# Patient Record
Sex: Female | Born: 1980 | Race: White | Hispanic: No | Marital: Married | Smoking: Never smoker
Health system: Southern US, Community
[De-identification: ages and names within clinical notes are randomized; demographics above are authoritative.]

## PROBLEM LIST (undated history)

## (undated) DIAGNOSIS — R519 Headache, unspecified: Secondary | ICD-10-CM

## (undated) DIAGNOSIS — T8859XA Other complications of anesthesia, initial encounter: Secondary | ICD-10-CM

## (undated) DIAGNOSIS — Z803 Family history of malignant neoplasm of breast: Secondary | ICD-10-CM

## (undated) DIAGNOSIS — T4145XA Adverse effect of unspecified anesthetic, initial encounter: Secondary | ICD-10-CM

## (undated) DIAGNOSIS — R51 Headache: Secondary | ICD-10-CM

## (undated) DIAGNOSIS — IMO0002 Reserved for concepts with insufficient information to code with codable children: Secondary | ICD-10-CM

## (undated) HISTORY — PX: TONSILLECTOMY: SUR1361

## (undated) HISTORY — DX: Family history of malignant neoplasm of breast: Z80.3

---

## 2004-10-11 ENCOUNTER — Emergency Department (HOSPITAL_COMMUNITY): Admission: EM | Admit: 2004-10-11 | Discharge: 2004-10-11 | Payer: Self-pay | Admitting: Emergency Medicine

## 2004-10-26 ENCOUNTER — Ambulatory Visit (HOSPITAL_COMMUNITY): Admission: RE | Admit: 2004-10-26 | Discharge: 2004-10-26 | Payer: Self-pay | Admitting: Preventative Medicine

## 2004-11-02 ENCOUNTER — Encounter (HOSPITAL_COMMUNITY): Admission: RE | Admit: 2004-11-02 | Discharge: 2004-12-02 | Payer: Self-pay | Admitting: Preventative Medicine

## 2010-04-10 ENCOUNTER — Emergency Department: Payer: Self-pay

## 2010-08-11 ENCOUNTER — Emergency Department: Payer: Self-pay | Admitting: Emergency Medicine

## 2010-09-08 ENCOUNTER — Encounter: Payer: Self-pay | Admitting: Obstetrics & Gynecology

## 2010-10-31 ENCOUNTER — Other Ambulatory Visit: Payer: Self-pay | Admitting: Neurology

## 2011-02-13 ENCOUNTER — Ambulatory Visit: Payer: Self-pay | Admitting: Obstetrics & Gynecology

## 2011-02-14 ENCOUNTER — Inpatient Hospital Stay: Payer: Self-pay | Admitting: Obstetrics & Gynecology

## 2011-12-05 HISTORY — PX: DILATION AND CURETTAGE OF UTERUS: SHX78

## 2012-03-23 ENCOUNTER — Emergency Department: Payer: Self-pay | Admitting: Emergency Medicine

## 2012-03-23 LAB — COMPREHENSIVE METABOLIC PANEL
Albumin: 4.3 g/dL (ref 3.4–5.0)
Alkaline Phosphatase: 62 U/L (ref 50–136)
Anion Gap: 10 (ref 7–16)
BUN: 13 mg/dL (ref 7–18)
Bilirubin,Total: 0.3 mg/dL (ref 0.2–1.0)
Calcium, Total: 8.9 mg/dL (ref 8.5–10.1)
Chloride: 107 mmol/L (ref 98–107)
Co2: 23 mmol/L (ref 21–32)
Creatinine: 0.56 mg/dL — ABNORMAL LOW (ref 0.60–1.30)
EGFR (African American): >60
EGFR (Non-African Amer.): >60
Glucose: 89 mg/dL (ref 65–99)
Osmolality: 279 (ref 275–301)
Potassium: 3.9 mmol/L (ref 3.5–5.1)
SGOT(AST): 17 U/L (ref 15–37)
SGPT (ALT): 21 U/L
Sodium: 140 mmol/L (ref 136–145)
Total Protein: 7.6 g/dL (ref 6.4–8.2)

## 2012-03-23 LAB — URINALYSIS, COMPLETE
Bilirubin,UR: NEGATIVE
Blood: NEGATIVE
Glucose,UR: NEGATIVE mg/dL (ref 0–75)
Ketone: NEGATIVE
Leukocyte Esterase: NEGATIVE
Nitrite: NEGATIVE
Ph: 7 (ref 4.5–8.0)
Protein: NEGATIVE
RBC,UR: <1 /HPF (ref 0–5)
Specific Gravity: 1.006 (ref 1.003–1.030)
Squamous Epithelial: <1
WBC UR: 1 /HPF (ref 0–5)

## 2012-03-23 LAB — CBC
HCT: 43 % (ref 35.0–47.0)
HGB: 13.8 g/dL (ref 12.0–16.0)
MCH: 28 pg (ref 26.0–34.0)
MCHC: 32.1 g/dL (ref 32.0–36.0)
MCV: 87 fL (ref 80–100)
Platelet: 229 10*3/uL (ref 150–440)
RBC: 4.91 10*6/uL (ref 3.80–5.20)
RDW: 13.3 % (ref 11.5–14.5)
WBC: 6.7 10*3/uL (ref 3.6–11.0)

## 2012-03-23 LAB — PREGNANCY, URINE: Pregnancy Test, Urine: POSITIVE m[IU]/mL

## 2012-03-23 LAB — LIPASE, BLOOD: Lipase: 121 U/L (ref 73–393)

## 2012-03-23 LAB — WET PREP, GENITAL

## 2012-03-23 LAB — HCG, QUANTITATIVE, PREGNANCY: Beta Hcg, Quant.: 27725 m[IU]/mL — ABNORMAL HIGH

## 2012-04-09 LAB — BASIC METABOLIC PANEL
Anion Gap: 9 (ref 7–16)
BUN: 13 mg/dL (ref 7–18)
Calcium, Total: 9.7 mg/dL (ref 8.5–10.1)
Chloride: 104 mmol/L (ref 98–107)
Co2: 23 mmol/L (ref 21–32)
Creatinine: 0.72 mg/dL (ref 0.60–1.30)
EGFR (African American): >60
EGFR (Non-African Amer.): >60
Glucose: 114 mg/dL — ABNORMAL HIGH (ref 65–99)
Osmolality: 273 (ref 275–301)
Potassium: 3.7 mmol/L (ref 3.5–5.1)
Sodium: 136 mmol/L (ref 136–145)

## 2012-04-09 LAB — URINALYSIS, COMPLETE
Bilirubin,UR: NEGATIVE
Blood: NEGATIVE
Glucose,UR: NEGATIVE mg/dL (ref 0–75)
Leukocyte Esterase: NEGATIVE
Nitrite: NEGATIVE
Ph: 8 (ref 4.5–8.0)
Protein: NEGATIVE
RBC,UR: NONE SEEN /HPF (ref 0–5)
Specific Gravity: 1.018 (ref 1.003–1.030)
Squamous Epithelial: NONE SEEN
WBC UR: 1 /HPF (ref 0–5)

## 2012-04-09 LAB — CBC
HCT: 44.8 % (ref 35.0–47.0)
HGB: 15.1 g/dL (ref 12.0–16.0)
MCH: 29.4 pg (ref 26.0–34.0)
MCHC: 33.7 g/dL (ref 32.0–36.0)
MCV: 87 fL (ref 80–100)
Platelet: 268 10*3/uL (ref 150–440)
RBC: 5.13 10*6/uL (ref 3.80–5.20)
RDW: 13.6 % (ref 11.5–14.5)
WBC: 10.8 10*3/uL (ref 3.6–11.0)

## 2012-04-09 LAB — HCG, QUANTITATIVE, PREGNANCY: Beta Hcg, Quant.: 1455 m[IU]/mL — ABNORMAL HIGH

## 2012-04-09 LAB — WET PREP, GENITAL

## 2012-04-10 ENCOUNTER — Observation Stay: Payer: Self-pay | Admitting: Obstetrics & Gynecology

## 2012-04-10 LAB — HEMOGLOBIN: HGB: 10.8 g/dL — ABNORMAL LOW (ref 12.0–16.0)

## 2012-04-10 LAB — HEMATOCRIT: HCT: 32.4 % — ABNORMAL LOW (ref 35.0–47.0)

## 2012-04-11 LAB — PATHOLOGY REPORT

## 2014-07-15 ENCOUNTER — Ambulatory Visit: Payer: Self-pay | Admitting: Obstetrics & Gynecology

## 2014-07-15 LAB — CBC WITH DIFFERENTIAL/PLATELET
Basophil #: 0.1 10*3/uL (ref 0.0–0.1)
Basophil %: 0.7 %
Eosinophil #: 0.3 10*3/uL (ref 0.0–0.7)
Eosinophil %: 3.2 %
HCT: 36 % (ref 35.0–47.0)
HGB: 11.4 g/dL — ABNORMAL LOW (ref 12.0–16.0)
Lymphocyte #: 1.5 10*3/uL (ref 1.0–3.6)
Lymphocyte %: 16.2 %
MCH: 26.6 pg (ref 26.0–34.0)
MCHC: 31.7 g/dL — ABNORMAL LOW (ref 32.0–36.0)
MCV: 84 fL (ref 80–100)
Monocyte #: 0.7 x10 3/mm (ref 0.2–0.9)
Monocyte %: 7.2 %
Neutrophil #: 6.7 10*3/uL — ABNORMAL HIGH (ref 1.4–6.5)
Neutrophil %: 72.7 %
Platelet: 182 10*3/uL (ref 150–440)
RBC: 4.3 10*6/uL (ref 3.80–5.20)
RDW: 17.9 % — ABNORMAL HIGH (ref 11.5–14.5)
WBC: 9.2 10*3/uL (ref 3.6–11.0)

## 2014-07-16 ENCOUNTER — Inpatient Hospital Stay: Payer: Self-pay | Admitting: Obstetrics & Gynecology

## 2014-07-17 LAB — HEMATOCRIT: HCT: 33.5 % — ABNORMAL LOW (ref 35.0–47.0)

## 2015-03-27 NOTE — Op Note (Signed)
PATIENT NAME:  Amber RaddleMCKINNEY, [name] MR#:  [id number] DATE OF BIRTH:  [birth year]  DATE OF PROCEDURE:  07/16/2014  PREOPERATIVE DIAGNOSES:   1.  Term intrauterine pregnancy. 2.  Prior history of cesarean section.   POSTOPERATIVE DIAGNOSES:  1.  Term intrauterine pregnancy. 2.  Prior history of cesarean section.   PROCEDURE:  1.  Low transverse cesarean section. 2.  Placement of On-Q pain pump.   SURGEON: Dierdre Searles. Paul Rhiann Boucher, MD.  ASSISTANVergie Living: [place].  ANESTHESIA: General.  ESTIMATED BLOOD LOSS: 300 mL.   COMPLICATIONS: None.   FINDINGS: Normal tubes, ovaries and uterus. Viable female weighing 615 with Apgar scores of 9 and 9.    DISPOSITION:  Recovery in stable.   TECHNIQUE: The patient is prepped and draped in the usual sterile fashion. After adequate anesthesia is obtained in the supine position on the operating table, scalpel is used to create a low transverse skin incision in the area of the prior scar, down to the level of the rectus fascia. The rectus fascia dissected bilaterally using Mayo scissors. The rectus muscle is separated in the midline. The peritoneum is penetrated and the bladder inferiorly dissected and retracted. A scalpel is used to create a low transverse hysterotomy incision that has been extended by blunt dissection.  Amniotomy then reveals clear fluid and the head is easily delivered without the use of a vacuum device. There oropharynx is suctioned and the nuchal cords are reduced, and the infant is completely delivered at that time.   Cord blood is not necessary as she has an A positive blood type and so the placenta is manually extracted. The uterus is externalized and cleansed of all membranes and debris using a moist sponge. The hysterotomy incision is closed with a running 1 Vicryl suture in a locking fashion with additional suture placed to obtain excellent hemostasis. The bladder is deemed to be out of harm's way and clear urine is noted in the catheter. The uterus was  placed back into the intra-abdominal cavity and the pericolic gutters area irrigated with warm saline. Re-examination  reveals excellent hemostasis.   The peritoneum is closed with a Vicryl suture. Trocars are placed through the abdomen into the  subfascial space and then the SilverSoaker catheters, that is associated with the On-Q pain pump, are then threaded into place. The rectus fascia is closed with 0 Maxon suture with careful placement not to incorporate these catheters.  The catheters are flushed and with 5 mL each of bupivacaine.   Subcutaneous tissues are irrigated and hemostasis and hemostasis is assured using electrocautery. Skin is closed with a 4-0 Vicryl in a subcuticular fashion followed by placement of Dermabond.  Patient left for the recovery room in stable condition.  All sponge, instrument, and needle counts are correct.   ____________________________ R. Annamarie MajorPaul Stein Windhorst, MD rph:TT D: 07/16/2014 08:49:00 ET T: 07/16/2014 11:33:20 ET JOB#: [id number]  cc: Dierdre Searles. Paul Abdulloh Ullom, MD, <Dictator> Nadara MustardOBERT P Josanna Hefel MD ELECTRONICALLY SIGNED 07/17/2014 0:40

## 2015-03-28 NOTE — Op Note (Signed)
PATIENT NAME:  Amber RaddleMCKINNEY, Amber Reid MR#:  [id number] DATE OF BIRTH:  08-Apr-1981  DATE OF PROCEDURE:  04/09/2012  PREOPERATIVE DIAGNOSIS: Incomplete abortion.   POSTOPERATIVE DIAGNOSIS: Incomplete abortion.  PROCEDURE: Suction dilatation and curettage.   SURGEON: Dierdre Searles. Paul Karlie Aung, M.D.   ANESTHESIA: General.   ESTIMATED BLOOD LOSS: Minimal.   COMPLICATIONS: None.   SPECIMEN: Products of conception.   DISPOSITION: To the recovery room in stable condition.   TECHNIQUE: The patient is prepped and draped in the usual sterile fashion, after adequate anesthesia is obtained, in the dorsal lithotomy position. Foley catheter which was placed in the emergency room is removed at this time. A speculum is placed and the anterior lip of the cervix is grasped with a tenaculum. The cervix is easily dilated to a size 20 Pratt dilator. The uterus is sounded to 10 cm. Grasping forceps are used to remove any initial products of conception. A 10 mm rigid suction curette is placed with aspiration of products of conception. Gentle curettage is performed with a banjo curette followed by repeat aspiration. Tenaculum is removed with excellent hemostasis noted. The patient goes to the recovery room in stable condition. All sponge, instrument, and needle counts are correct.  ____________________________ R. Annamarie MajorPaul Havier Deeb, MD rph:slb D: 04/10/2012 00:15:42 ET     T: 04/10/2012 10:19:08 ET        JOB#: [id number] Amber Carbajal P Braxley Balandran MD ELECTRONICALLY SIGNED 04/19/2012 10:07

## 2015-09-07 ENCOUNTER — Other Ambulatory Visit: Payer: Self-pay

## 2015-09-08 ENCOUNTER — Encounter: Payer: Self-pay | Admitting: *Deleted

## 2015-09-08 ENCOUNTER — Other Ambulatory Visit: Payer: Self-pay

## 2015-09-08 NOTE — Patient Instructions (Signed)
  Your procedure is scheduled on: 09-10-15 Report to [facility] 2ND FLOOR To find out your arrival time please call [phone] between 1PM - 3PM on 09-09-15  Remember: Instructions that are not followed completely may result in serious medical risk, up to and including death, or upon the discretion of your surgeon and anesthesiologist your surgery may need to be rescheduled.    _X___ 1. Do not eat food or drink liquids after midnight. No gum chewing or hard candies.     _X___ 2. No Alcohol for 24 hours before or after surgery.   ____ 3. Bring all medications with you on the day of surgery if instructed.    ____ 4. Notify your doctor if there is any change in your medical condition     (cold, fever, infections).     Do not wear jewelry, make-up, hairpins, clips or nail polish.  Do not wear lotions, powders, or perfumes. You may wear deodorant.  Do not shave 48 hours prior to surgery. Men may shave face and neck.  Do not bring valuables to the hospital.    [facility] is not responsible for any belongings or valuables.               Contacts, dentures or bridgework may not be worn into surgery.  Leave your suitcase in the car. After surgery it may be brought to your room.  For patients admitted to the hospital, discharge time is determined by your treatment team.   Patients discharged the day of surgery will not be allowed to drive home.   Please read over the following fact sheets that you were given:      ____ Take these medicines the morning of surgery with A SIP OF WATER:    1.NONE  2.   3.   4.  5.  6.  ____ Fleet Enema (as directed)   ____ Use CHG Soap as directed  ____ Use inhalers on the day of surgery  ____ Stop metformin 2 days prior to surgery    ____ Take 1/2 of usual insulin dose the night before surgery and none on the morning of surgery.   ____ Stop Coumadin/Plavix/aspirin-N/A  ____ Stop Anti-inflammatories-NO NSAIDS OR ASA  PRODUCTS-TYLENOL OK   ____ Stop supplements until after surgery.    ____ Bring C-Pap to the hospital.  

## 2015-09-10 ENCOUNTER — Ambulatory Visit
Admission: RE | Admit: 2015-09-10 | Discharge: 2015-09-10 | Disposition: A | Discharge status: Home / Self Care | Payer: BLUE CROSS/BLUE SHIELD | Source: Ambulatory Visit | Attending: Obstetrics & Gynecology | Admitting: Obstetrics & Gynecology

## 2015-09-10 ENCOUNTER — Encounter: Payer: Self-pay | Admitting: *Deleted

## 2015-09-10 ENCOUNTER — Ambulatory Visit: Payer: BLUE CROSS/BLUE SHIELD | Admitting: Anesthesiology

## 2015-09-10 ENCOUNTER — Encounter
Admission: RE | Disposition: A | Discharge status: Home / Self Care | Payer: Self-pay | Source: Ambulatory Visit | Attending: Obstetrics & Gynecology

## 2015-09-10 DIAGNOSIS — Z8249 Family history of ischemic heart disease and other diseases of the circulatory system: Secondary | ICD-10-CM | POA: Diagnosis not present

## 2015-09-10 DIAGNOSIS — L728 Other follicular cysts of the skin and subcutaneous tissue: Secondary | ICD-10-CM | POA: Diagnosis not present

## 2015-09-10 DIAGNOSIS — Z8051 Family history of malignant neoplasm of kidney: Secondary | ICD-10-CM | POA: Insufficient documentation

## 2015-09-10 DIAGNOSIS — Z818 Family history of other mental and behavioral disorders: Secondary | ICD-10-CM | POA: Insufficient documentation

## 2015-09-10 DIAGNOSIS — Z808 Family history of malignant neoplasm of other organs or systems: Secondary | ICD-10-CM | POA: Insufficient documentation

## 2015-09-10 DIAGNOSIS — Z888 Allergy status to other drugs, medicaments and biological substances status: Secondary | ICD-10-CM | POA: Diagnosis not present

## 2015-09-10 DIAGNOSIS — Z833 Family history of diabetes mellitus: Secondary | ICD-10-CM | POA: Insufficient documentation

## 2015-09-10 DIAGNOSIS — Z825 Family history of asthma and other chronic lower respiratory diseases: Secondary | ICD-10-CM | POA: Insufficient documentation

## 2015-09-10 HISTORY — DX: Adverse effect of unspecified anesthetic, initial encounter: T41.45XA

## 2015-09-10 HISTORY — DX: Reserved for concepts with insufficient information to code with codable children: IMO0002

## 2015-09-10 HISTORY — DX: Headache: R51

## 2015-09-10 HISTORY — PX: LAPAROTOMY: SHX154

## 2015-09-10 HISTORY — DX: Headache, unspecified: R51.9

## 2015-09-10 HISTORY — DX: Other complications of anesthesia, initial encounter: T88.59XA

## 2015-09-10 LAB — POCT PREGNANCY, URINE: Preg Test, Ur: NEGATIVE

## 2015-09-10 SURGERY — LAPAROTOMY, EXPLORATORY
Anesthesia: General

## 2015-09-10 MED ORDER — FAMOTIDINE 20 MG PO TABS
ORAL_TABLET | ORAL | Status: AC
  Administered 2015-09-10: 20 mg via ORAL
  Filled 2015-09-10: qty 1

## 2015-09-10 MED ORDER — KETOROLAC TROMETHAMINE 30 MG/ML IJ SOLN
30.0000 mg | Freq: Four times a day (QID) | INTRAMUSCULAR | Status: DC
  Administered 2015-09-10: 30 mg via INTRAVENOUS

## 2015-09-10 MED ORDER — PROPOFOL 10 MG/ML IV BOLUS
INTRAVENOUS | Status: DC | PRN
  Administered 2015-09-10 (×2): 20 mg via INTRAVENOUS
  Administered 2015-09-10: 50 mg via INTRAVENOUS
  Administered 2015-09-10: 20 mg via INTRAVENOUS

## 2015-09-10 MED ORDER — FAMOTIDINE 20 MG PO TABS
20.0000 mg | ORAL_TABLET | Freq: Once | ORAL | Status: AC
  Administered 2015-09-10: 20 mg via ORAL

## 2015-09-10 MED ORDER — ONDANSETRON HCL 4 MG/2ML IJ SOLN
4.0000 mg | Freq: Once | INTRAMUSCULAR | Status: AC | PRN
  Administered 2015-09-10: 4 mg via INTRAVENOUS

## 2015-09-10 MED ORDER — ONDANSETRON HCL 4 MG/2ML IJ SOLN
INTRAMUSCULAR | Status: AC
  Administered 2015-09-10: 4 mg via INTRAVENOUS
  Filled 2015-09-10: qty 2

## 2015-09-10 MED ORDER — LIDOCAINE-EPINEPHRINE 1 %-1:100000 IJ SOLN
INTRAMUSCULAR | Status: AC
  Filled 2015-09-10: qty 2

## 2015-09-10 MED ORDER — ACETAMINOPHEN 650 MG RE SUPP: 650.0000 mg | RECTAL | Status: DC | PRN

## 2015-09-10 MED ORDER — PROPOFOL 500 MG/50ML IV EMUL
INTRAVENOUS | Status: DC | PRN
  Administered 2015-09-10: 100 ug/kg/min via INTRAVENOUS
  Administered 2015-09-10: 17:00:00 via INTRAVENOUS

## 2015-09-10 MED ORDER — KETOROLAC TROMETHAMINE 30 MG/ML IJ SOLN
INTRAMUSCULAR | Status: AC
  Administered 2015-09-10: 30 mg via INTRAVENOUS
  Filled 2015-09-10: qty 1

## 2015-09-10 MED ORDER — ACETAMINOPHEN 325 MG PO TABS: 650.0000 mg | ORAL_TABLET | ORAL | Status: DC | PRN

## 2015-09-10 MED ORDER — OXYCODONE HCL 5 MG PO TABS: 5.0000 mg | ORAL_TABLET | ORAL | Status: DC | PRN

## 2015-09-10 MED ORDER — LACTATED RINGERS IV SOLN
INTRAVENOUS | Status: DC
  Administered 2015-09-10: 15:00:00 via INTRAVENOUS

## 2015-09-10 MED ORDER — LIDOCAINE HCL (CARDIAC) 20 MG/ML IV SOLN
INTRAVENOUS | Status: DC | PRN
  Administered 2015-09-10: 50 mg via INTRAVENOUS

## 2015-09-10 MED ORDER — LIDOCAINE HCL (PF) 1 % IJ SOLN
INTRAMUSCULAR | Status: AC
  Filled 2015-09-10: qty 30

## 2015-09-10 SURGICAL SUPPLY — 36 items
BAG COUNTER SPONGE EZ (MISCELLANEOUS) ×2 IMPLANT
BAG SPNG 4X4 CLR HAZ (MISCELLANEOUS) ×1
CANISTER SUCT 1200ML W/VALVE (MISCELLANEOUS) ×3 IMPLANT
CATH FOL LEG HOLDER (MISCELLANEOUS) ×3 IMPLANT
CATH TRAY 16F METER LATEX (MISCELLANEOUS) ×3 IMPLANT
CLOSURE WOUND 1/2 X4 (GAUZE/BANDAGES/DRESSINGS) ×1
COUNTER SPONGE BAG EZ (MISCELLANEOUS) ×1
DRAPE LAPAROTOMY 100X77 ABD (DRAPES) ×3 IMPLANT
DRAPE LAPAROTOMY TRNSV 106X77 (MISCELLANEOUS) ×3 IMPLANT
DRSG TELFA 3X8 NADH (GAUZE/BANDAGES/DRESSINGS) ×3 IMPLANT
ELECT BLADE 6 FLAT ULTRCLN (ELECTRODE) ×3 IMPLANT
ELECT CAUTERY BLADE 6.4 (BLADE) ×3 IMPLANT
GAUZE SPONGE 4X4 12PLY STRL (GAUZE/BANDAGES/DRESSINGS) ×3 IMPLANT
GLOVE BIO SURGEON STRL SZ8 (GLOVE) ×3 IMPLANT
GOWN STRL REUS W/ TWL LRG LVL3 (GOWN DISPOSABLE) ×1 IMPLANT
GOWN STRL REUS W/ TWL XL LVL3 (GOWN DISPOSABLE) ×1 IMPLANT
GOWN STRL REUS W/TWL LRG LVL3 (GOWN DISPOSABLE) ×3
GOWN STRL REUS W/TWL XL LVL3 (GOWN DISPOSABLE) ×3
KIT RM TURNOVER STRD PROC AR (KITS) ×3 IMPLANT
LABEL OR SOLS (LABEL) ×3 IMPLANT
NS IRRIG 1000ML POUR BTL (IV SOLUTION) ×3 IMPLANT
PACK BASIN MAJOR ARMC (MISCELLANEOUS) ×3 IMPLANT
PAD DRESSING TELFA 3X8 NADH (GAUZE/BANDAGES/DRESSINGS) ×1 IMPLANT
PAD GROUND ADULT SPLIT (MISCELLANEOUS) ×3 IMPLANT
PAD OB MATERNITY 4.3X12.25 (PERSONAL CARE ITEMS) ×3 IMPLANT
SPONGE LAP 18X18 5 PK (GAUZE/BANDAGES/DRESSINGS) ×3 IMPLANT
STAPLER SKIN PROX 35W (STAPLE) ×3 IMPLANT
STRIP CLOSURE SKIN 1/2X4 (GAUZE/BANDAGES/DRESSINGS) ×2 IMPLANT
SUT ETHIBOND CT1 BRD #0 30IN (SUTURE) ×3 IMPLANT
SUT VIC AB 0 CT1 27 (SUTURE) ×3
SUT VIC AB 0 CT1 27XCR 8 STRN (SUTURE) ×1 IMPLANT
SUT VIC AB 1 CT1 36 (SUTURE) ×3 IMPLANT
SUT VIC AB 4-0 PS2 18 (SUTURE) ×3 IMPLANT
SYR 30ML LL (SYRINGE) ×3 IMPLANT
SYRINGE 10CC LL (SYRINGE) ×3 IMPLANT
TRAY PREP VAG/GEN (MISCELLANEOUS) ×3 IMPLANT

## 2015-09-10 NOTE — Op Note (Signed)
  Operative Note  09/10/2015  PRE-OP DIAGNOSIS:  INCISIONAL MASS   POST-OP DIAGNOSIS: same   PROCEDURE: Procedure(s):      Excision of abdominal mass   SURGEON: Annamarie Major, MD, FACOG   ANESTHESIA: Local Sedation   ESTIMATED BLOOD LOSS: Minimal  SPECIMENS: Subcutaneous cystic mass  COMPLICATIONS: None  DISPOSITION: PACU - hemodynamically stable.  CONDITION: stable  FINDINGS: Cystic mass underneath cesarean scar, no fascial penetration.  PROCEDURE IN DETAIL: After informed consent was obtained, the patient was taken to the operating room where sedation anesthesia was performed without difficulty. The patient was positioned in the supine position and the usual precautions taken with her arms in arm boards bilaterally.She was prepped and draped in normal sterile fashion. Time-out was performed.  A small incision was made just over the 4cm palpable mass after marcaine 1/2% with epi was used (5 mL) to infiltrate the skin.  Cyst was grasped with Allis clamps to excise.  It was penetrated during the excision with clear/yellow thin fluid noted.  Continued dissection around the cystic mass was performed until complete excision is accomplished with no remnants of tissue left behind.  Irrigation performed.  Hemostasis assured.  Deep layer of 2-0 vicryl and skin layer of 4-0 vicryl used followed by skin adhesive.  Sponge, lap, needle and instrument counts were correct x2 at the end of the procedure. Patient goes to recovery room in stable condition.

## 2015-09-10 NOTE — Anesthesia Preprocedure Evaluation (Signed)
Anesthesia Evaluation  Patient identified by MRN, date of birth, ID band Patient awake    Reviewed: Allergy & Precautions, NPO status , Patient's Chart, lab work & pertinent test results  History of Anesthesia Complications Negative for: history of anesthetic complications  Airway Mallampati: I  TM Distance: >3 FB Neck ROM: Full    Dental  (+) Teeth Intact   Pulmonary neg pulmonary ROS,           Cardiovascular negative cardio ROS       Neuro/Psych negative neurological ROS     GI/Hepatic negative GI ROS, Neg liver ROS,   Endo/Other  negative endocrine ROS  Renal/GU negative Renal ROS     Musculoskeletal   Abdominal   Peds  Hematology negative hematology ROS (+)   Anesthesia Other Findings   Reproductive/Obstetrics                             Anesthesia Physical Anesthesia Plan  ASA: II  Anesthesia Plan: General   Post-op Pain Management:    Induction: Intravenous  Airway Management Planned:   Additional Equipment:   Intra-op Plan:   Post-operative Plan:   Informed Consent: I have reviewed the patients History and Physical, chart, labs and discussed the procedure including the risks, benefits and alternatives for the proposed anesthesia with the patient or authorized representative who has indicated his/her understanding and acceptance.     Plan Discussed with:   Anesthesia Plan Comments:         Anesthesia Quick Evaluation

## 2015-09-10 NOTE — H&P (Signed)
History and Physical Interval Note:  09/10/2015 2:54 PM  Amber Reid  has presented today for surgery, with the diagnosis of INCISIONAL MASS  The various methods of treatment have been discussed with the patient and family. After consideration of risks, benefits and other options for treatment, the patient has consented to  Procedure(s): EXCISION of ABDOMIAL SUBCUTAENOUS MASS/ Incisional Mass as a surgical intervention .  The patient's history has been reviewed, patient examined, no change in status, stable for surgery.  Pt has the following beta blocker history-  Not taking Beta Blocker.  I have reviewed the patient's chart and labs.  Questions were answered to the patient's satisfaction.       Benzion Mesta [name]

## 2015-09-10 NOTE — Discharge Instructions (Signed)
General Gynecological Post-Operative Instructions °You may expect to feel dizzy, weak, and drowsy for as long as 24 hours after receiving the medicine that made you sleep (anesthetic).  °Do not drive a car, ride a bicycle, participate in physical activities, or take public transportation until you are done taking narcotic pain medicines or as directed by your doctor.  °Do not drink alcohol or take tranquilizers.  °Do not take medicine that has not been prescribed by your doctor.  °Do not sign important papers or make important decisions while on narcotic pain medicines.  °Have a responsible person with you.  °CARE OF INCISION  °Keep incision clean and dry. °Take showers instead of baths until your doctor gives you permission to take baths.  °Avoid heavy lifting (more than 10 pounds/4.5 kilograms), pushing, or pulling.  °Avoid activities that may risk injury to your surgical site.  °No sexual intercourse or placement of anything in the vagina for 1 weeks or as instructed by your doctor. °If you have tubes coming from the wound site, check with your doctor regarding appropriate care of the tubes. °Only take prescription or over-the-counter medicines  for pain, discomfort, or fever as directed by your doctor. Do not take aspirin. It can make you bleed. Take medicines (antibiotics) that kill germs if they are prescribed for you.  °Call the office or go to the ER if:  °You feel sick to your stomach (nauseous) and you start to throw up (vomit).  °You have trouble eating or drinking.  °You have an oral temperature above 101.  °You have constipation that is not helped by adjusting diet or increasing fluid intake. Pain medicines are a common cause of constipation.  °You have any other concerns. °SEEK IMMEDIATE MEDICAL CARE IF:  °You have persistent dizziness.  °You have difficulty breathing or a congested sounding (croupy) cough.  °You have an oral temperature above 102.5, not controlled by medicine.  °There is increasing  pain or tenderness near or in the surgical site.  ° °AMBULATORY SURGERY  °DISCHARGE INSTRUCTIONS ° ° °1) The drugs that you were given will stay in your system until tomorrow so for the next 24 hours you should not: ° °A) Drive an automobile °B) Make any legal decisions °C) Drink any alcoholic beverage ° ° °2) You may resume regular meals tomorrow.  Today it is better to start with liquids and gradually work up to solid foods. ° °You may eat anything you prefer, but it is better to start with liquids, then soup and crackers, and gradually work up to solid foods. ° ° °3) Please notify your doctor immediately if you have any unusual bleeding, trouble breathing, redness and pain at the surgery site, drainage, fever, or pain not relieved by medication. ° ° ° °4) Additional Instructions: ° ° ° ° ° ° ° °Please contact your physician with any problems or Same Day Surgery at [phone], Monday through Friday 6 am to 4 pm, or [place] [place] Main number at [phone]. ° °

## 2015-09-10 NOTE — Transfer of Care (Signed)
Immediate Anesthesia Transfer of Care Note  Patient: Amber Reid  Procedure(s) Performed: Procedure(s): incision of abdominal mass (N/A)  Patient Location: PACU  Anesthesia Type:General  Level of Consciousness: awake  Airway & Oxygen Therapy: Patient Spontanous Breathing and Patient connected to nasal cannula oxygen  Post-op Assessment: Report given to RN  Post vital signs: Reviewed  Last Vitals:  Filed Vitals:   09/10/15 1719  BP: 103/64  Pulse: 64  Temp: 36.2 C  Resp: 18    Complications: No apparent anesthesia complications

## 2015-09-13 ENCOUNTER — Encounter: Payer: Self-pay | Admitting: Obstetrics & Gynecology

## 2015-09-14 LAB — SURGICAL PATHOLOGY

## 2015-09-14 NOTE — Anesthesia Postprocedure Evaluation (Signed)
  Anesthesia Post-op Note  Patient: Amber Reid  Procedure(s) Performed: Procedure(s): incision of abdominal mass (N/A)  Anesthesia type:General  Patient location: PACU  Post pain: Pain level controlled  Post assessment: Post-op Vital signs reviewed, Patient's Cardiovascular Status Stable, Respiratory Function Stable, Patent Airway and No signs of Nausea or vomiting  Post vital signs: Reviewed and stable  Last Vitals:  Filed Vitals:   09/10/15 1801  BP: 106/65  Pulse: 56  Temp: 35.8 C  Resp:     Level of consciousness: awake, alert  and patient cooperative  Complications: No apparent anesthesia complications

## 2020-08-02 ENCOUNTER — Encounter: Payer: Self-pay | Admitting: Obstetrics & Gynecology

## 2020-08-04 ENCOUNTER — Other Ambulatory Visit: Payer: Self-pay | Admitting: Oncology

## 2020-08-04 ENCOUNTER — Telehealth: Payer: Self-pay | Admitting: Oncology

## 2020-08-04 DIAGNOSIS — U071 COVID-19: Secondary | ICD-10-CM

## 2020-08-04 NOTE — Telephone Encounter (Signed)
I connected by phone with  to discuss the potential use of an new treatment for mild to moderate COVID-19 viral infection in non-hospitalized patients.   This patient is a age/sex that meets the FDA criteria for Emergency Use Authorization of casirivimab\imdevimab.  Has a (+) direct SARS-CoV-2 viral test result 1. Has mild or moderate COVID-19  2. Is ? 40 years of age and weighs ? 40 kg 3. Is NOT hospitalized due to COVID-19 4. Is NOT requiring oxygen therapy or requiring an increase in baseline oxygen flow rate due to COVID-19 5. Is within 10 days of symptom onset 6. Has at least one of the high risk factor(s) for progression to severe COVID-19 and/or hospitalization as defined in EUA. ? Specific high risk criteria : Obesity    Symptom onset  07/27/20   I have spoken and communicated the following to the patient or parent/caregiver:   1. FDA has authorized the emergency use of casirivimab\imdevimab for the treatment of mild to moderate COVID-19 in adults and pediatric patients with positive results of direct SARS-CoV-2 viral testing who are 40 years of age and older weighing at least 40 kg, and who are at high risk for progressing to severe COVID-19 and/or hospitalization.   2. The significant known and potential risks and benefits of casirivimab\imdevimab, and the extent to which such potential risks and benefits are unknown.   3. Information on available alternative treatments and the risks and benefits of those alternatives, including clinical trials.   4. Patients treated with casirivimab\imdevimab should continue to self-isolate and use infection control measures (e.g., wear mask, isolate, social distance, avoid sharing personal items, clean and disinfect "high touch" surfaces, and frequent handwashing) according to CDC guidelines.    5. The patient or parent/caregiver has the option to accept or refuse casirivimab\imdevimab .   After reviewing this information with the patient, The  patient agreed to proceed with receiving casirivimab\imdevimab infusion and will be provided a copy of the Fact sheet prior to receiving the infusion.Mignon Pine, AGNP-C [phone] (Infusion Center Hotline)

## 2020-08-05 ENCOUNTER — Other Ambulatory Visit: Payer: Self-pay | Admitting: Adult Health

## 2020-08-05 ENCOUNTER — Ambulatory Visit (HOSPITAL_COMMUNITY)
Admission: RE | Admit: 2020-08-05 | Discharge: 2020-08-05 | Disposition: A | Discharge status: Home / Self Care | Payer: No Typology Code available for payment source | Source: Ambulatory Visit | Attending: Pulmonary Disease | Admitting: Pulmonary Disease

## 2020-08-05 DIAGNOSIS — U071 COVID-19: Secondary | ICD-10-CM | POA: Insufficient documentation

## 2020-08-05 MED ORDER — FAMOTIDINE IN NACL 20-0.9 MG/50ML-% IV SOLN: 20.0000 mg | Freq: Once | INTRAVENOUS | Status: DC | PRN

## 2020-08-05 MED ORDER — ALBUTEROL SULFATE HFA 108 (90 BASE) MCG/ACT IN AERS: 2.0000 | INHALATION_SPRAY | Freq: Four times a day (QID) | RESPIRATORY_TRACT | 2 refills | Status: DC | PRN

## 2020-08-05 MED ORDER — SODIUM CHLORIDE 0.9 % IV SOLN: INTRAVENOUS | Status: DC | PRN

## 2020-08-05 MED ORDER — BENZONATATE 200 MG PO CAPS: 200.0000 mg | ORAL_CAPSULE | Freq: Three times a day (TID) | ORAL | 0 refills | Status: DC | PRN

## 2020-08-05 MED ORDER — PROMETHAZINE-PHENYLEPHRINE 6.25-5 MG/5ML PO SYRP: 2.5000 mL | ORAL_SOLUTION | Freq: Three times a day (TID) | ORAL | 0 refills | Status: DC | PRN

## 2020-08-05 MED ORDER — EPINEPHRINE 0.3 MG/0.3ML IJ SOAJ: 0.3000 mg | Freq: Once | INTRAMUSCULAR | Status: DC | PRN

## 2020-08-05 MED ORDER — ALBUTEROL SULFATE HFA 108 (90 BASE) MCG/ACT IN AERS: 2.0000 | INHALATION_SPRAY | Freq: Once | RESPIRATORY_TRACT | Status: DC | PRN

## 2020-08-05 MED ORDER — SODIUM CHLORIDE 0.9 % IV SOLN
1200.0000 mg | Freq: Once | INTRAVENOUS | Status: AC
  Administered 2020-08-05: 1200 mg via INTRAVENOUS
  Filled 2020-08-05: qty 10

## 2020-08-05 MED ORDER — DIPHENHYDRAMINE HCL 50 MG/ML IJ SOLN: 50.0000 mg | Freq: Once | INTRAMUSCULAR | Status: DC | PRN

## 2020-08-05 MED ORDER — METHYLPREDNISOLONE SODIUM SUCC 125 MG IJ SOLR: 125.0000 mg | Freq: Once | INTRAMUSCULAR | Status: DC | PRN

## 2020-08-05 NOTE — Discharge Instructions (Signed)

## 2020-08-05 NOTE — Progress Notes (Signed)
Patient has dry NP cough.  Sending in cough medications for her to take and inhaler.  Her PCP is out of town.  Lillard Anes, NP

## 2020-08-05 NOTE — Progress Notes (Signed)
  Diagnosis: COVID-19  Physician: Dr. Wright  Procedure: [facility] Med: casirivimab\imdevimab infusion - Provided patient with casirivimab\imdevimab fact sheet for patients, parents and caregivers prior to infusion.  Complications: No immediate complications noted.  Discharge: Discharged home   Amber Reid 08/05/2020   

## 2020-09-27 ENCOUNTER — Encounter: Payer: Self-pay | Admitting: Obstetrics & Gynecology

## 2020-09-27 ENCOUNTER — Other Ambulatory Visit: Payer: Self-pay

## 2020-09-27 ENCOUNTER — Ambulatory Visit (INDEPENDENT_AMBULATORY_CARE_PROVIDER_SITE_OTHER): Payer: No Typology Code available for payment source | Admitting: Obstetrics & Gynecology

## 2020-09-27 ENCOUNTER — Other Ambulatory Visit (HOSPITAL_COMMUNITY)
Admission: RE | Admit: 2020-09-27 | Discharge: 2020-09-27 | Disposition: A | Discharge status: Home / Self Care | Payer: No Typology Code available for payment source | Source: Ambulatory Visit | Attending: Obstetrics & Gynecology | Admitting: Obstetrics & Gynecology

## 2020-09-27 VITALS — BP 120/68 | Ht 70.0 in | Wt 141.0 lb

## 2020-09-27 DIAGNOSIS — Z124 Encounter for screening for malignant neoplasm of cervix: Secondary | ICD-10-CM

## 2020-09-27 DIAGNOSIS — Z01419 Encounter for gynecological examination (general) (routine) without abnormal findings: Secondary | ICD-10-CM | POA: Diagnosis not present

## 2020-09-27 DIAGNOSIS — N920 Excessive and frequent menstruation with regular cycle: Secondary | ICD-10-CM | POA: Diagnosis not present

## 2020-09-27 DIAGNOSIS — Z1231 Encounter for screening mammogram for malignant neoplasm of breast: Secondary | ICD-10-CM

## 2020-09-27 NOTE — Patient Instructions (Signed)
PAP every three years Mammogram every year    Call [phone] to schedule at [facility] yearly (with PCP)   Endometrial Ablation Endometrial ablation is a procedure that destroys the thin inner layer of the lining of the uterus (endometrium). This procedure may be done:  To stop heavy periods.  To stop bleeding that is causing anemia.  To control irregular bleeding.  To treat bleeding caused by small tumors (fibroids) in the endometrium. This procedure is often an alternative to major surgery, such as removal of the uterus and cervix (hysterectomy). As a result of this procedure:  You may not be able to have children. However, if you are premenopausal (you have not gone through menopause): ? You may still have a small chance of getting pregnant. ? You will need to use a reliable method of birth control after the procedure to prevent pregnancy.  You may stop having a menstrual period, or you may have only a small amount of bleeding during your period. Menstruation may return several years after the procedure. Tell a health care provider about:  Any allergies you have.  All medicines you are taking, including vitamins, herbs, eye drops, creams, and over-the-counter medicines.  Any problems you or family members have had with the use of anesthetic medicines.  Any blood disorders you have.  Any surgeries you have had.  Any medical conditions you have. What are the risks? Generally, this is a safe procedure. However, problems may occur, including:  A hole (perforation) in the uterus or bowel.  Infection of the uterus, bladder, or vagina.  Bleeding.  Damage to other structures or organs.  An air bubble in the lung (air embolus).  Problems with pregnancy after the procedure.  Failure of the procedure.  Decreased ability to diagnose cancer in the endometrium. What happens before the procedure?  You will have tests of your endometrium to make sure there are no  pre-cancerous cells or cancer cells present.  You may have an ultrasound of the uterus.  You may be given medicines to thin the endometrium.  Ask your health care provider about: ? Changing or stopping your regular medicines. This is especially important if you take diabetes medicines or blood thinners. ? Taking medicines such as aspirin and ibuprofen. These medicines can thin your blood. Do not take these medicines before your procedure if your doctor tells you not to.  Plan to have someone take you home from the hospital or clinic. What happens during the procedure?   You will lie on an exam table with your feet and legs supported as in a pelvic exam.  To lower your risk of infection: ? Your health care team will wash or sanitize their hands and put on germ-free (sterile) gloves. ? Your genital area will be washed with soap.  An IV tube will be inserted into one of your veins.  You will be given a medicine to help you relax (sedative).  A surgical instrument with a light and camera (resectoscope) will be inserted into your vagina and moved into your uterus. This allows your surgeon to see inside your uterus.  Endometrial tissue will be removed using one of the following methods: ? Radiofrequency. This method uses a radiofrequency-alternating electric current to remove the endometrium. ? Cryotherapy. This method uses extreme cold to freeze the endometrium. ? Heated-free liquid. This method uses a heated saltwater (saline) solution to remove the endometrium. ? Microwave. This method uses high-energy microwaves to heat up the endometrium and remove it. ?  Thermal balloon. This method involves inserting a catheter with a balloon tip into the uterus. The balloon tip is filled with heated fluid to remove the endometrium. The procedure may vary among health care providers and hospitals. What happens after the procedure?  Your blood pressure, heart rate, breathing rate, and blood oxygen  level will be monitored until the medicines you were given have worn off.  As tissue healing occurs, you may notice vaginal bleeding for 4-6 weeks after the procedure. You may also experience: ? Cramps. ? Thin, watery vaginal discharge that is light pink or brown in color. ? A need to urinate more frequently than usual. ? Nausea.  Do not drive for 24 hours if you were given a sedative.  Do not have sex or insert anything into your vagina until your health care provider approves. Summary  Endometrial ablation is done to treat the many causes of heavy menstrual bleeding.  The procedure may be done only after medications have been tried to control the bleeding.  Plan to have someone take you home from the hospital or clinic. This information is not intended to replace advice given to you by your health care provider. Make sure you discuss any questions you have with your health care provider. Document Revised: 05/07/2018 Document Reviewed: 12/07/2016 Elsevier Patient Education  2020 [employer].   Thank you for choosing [facility]. As part of our ongoing efforts to improve patient experience, we would appreciate your feedback. Please fill out the short survey that you will receive by mail or MyChart. Your opinion is important to [place]! - Dr. Tiburcio Pea

## 2020-09-27 NOTE — Progress Notes (Signed)
HPI:      [name] is a 38 y.o. [id number] who LMP was Patient's last menstrual period was 09/05/2020 (approximate)., she presents today for her annual examination. The patient has no complaints today.  She does report HEAVY REG PERIODS 7 days monthly w cramps and clots. Fatigue.   The patient is sexually active. Her last pap: approximate date 2016 and was normal and last mammogram: patient has never had a mammogram. The patient does perform self breast exams.  There is notable family history of breast or ovarian cancer in her family.  The patient has regular exercise: yes The patient denies current symptoms of depression.    GYN History: Contraception: condoms  PMHx: Past Medical History:  Diagnosis Date  . Chiari malformation    HER ONLY SYMPTOM IS UNSTEADY GAIT OCC WHICH MAY RESULT IN "RUNNING INTO A WALL"  . Complication of anesthesia    H/O BEING AWAKE DURING EARLY PART OF SURGERY EVEN WITH ANESTHESIA BUT NOT BEING ABLE TO MOVE  . Headache    MIGRAINES-WORSE WITH CHIARI MALFORMATION   Past Surgical History:  Procedure Laterality Date  . CESAREAN SECTION     X2  . DILATION AND CURETTAGE OF UTERUS  2013  . LAPAROTOMY N/A 09/10/2015   Procedure: incision of abdominal mass;  Surgeon: Nadara Mustard, MD;  Location: [facility] ORS;  Service: Gynecology;  Laterality: N/A;  . TONSILLECTOMY     Family History  Problem Relation Age of Onset  . Breast cancer Maternal Great-grandmother   . Breast cancer Paternal Great-grandmother   . Breast cancer Maternal Aunt 40  . Breast cancer Paternal Aunt 17  . Brain cancer Paternal Aunt 23  . Breast cancer Paternal Aunt 43  . Brain cancer Paternal Aunt 40   Social History   Tobacco Use  . Smoking status: Never Smoker  . Smokeless tobacco: Never Used  Vaping Use  . Vaping Use: Never used  Substance Use Topics  . Alcohol use: No  . Drug use: No    Current Outpatient Medications:  .  cyanocobalamin (,VITAMIN B-12,) 1000 MCG/ML  injection, 100 mcg SQ once daily for 7 days; then administer same dose on alternate days for 7 doses, then every 4 days for 3 weeks, Disp: , Rfl:  Allergies: Duragesic-100 [fentanyl], Chlorhexidine, and Latex  Review of Systems  Constitutional: Positive for malaise/fatigue. Negative for chills and fever.  HENT: Negative for congestion, sinus pain and sore throat.   Eyes: Negative for blurred vision and pain.  Respiratory: Negative for cough and wheezing.   Cardiovascular: Negative for chest pain and leg swelling.  Gastrointestinal: Negative for abdominal pain, constipation, diarrhea, heartburn, nausea and vomiting.  Genitourinary: Negative for dysuria, frequency, hematuria and urgency.  Musculoskeletal: Positive for joint pain. Negative for back pain, myalgias and neck pain.  Skin: Negative for itching and rash.  Neurological: Negative for dizziness, tremors and weakness.  Endo/Heme/Allergies: Does not bruise/bleed easily.  Psychiatric/Behavioral: Negative for depression. The patient is not nervous/anxious and does not have insomnia.     Objective: BP 120/68   Ht 5\' 10"  (1.778 m)   Wt 141 lb (64 kg)   LMP 09/05/2020 (Approximate)   BMI 20.23 kg/m   Filed Weights   09/27/20 0947  Weight: 141 lb (64 kg)   Body mass index is 20.23 kg/m. Physical Exam Constitutional:      General: She is not in acute distress.    Appearance: She is well-developed.  Genitourinary:  Pelvic exam was performed with patient supine.     Vagina, uterus and rectum normal.     No lesions in the vagina.     No vaginal bleeding.     No cervical motion tenderness, friability, lesion or polyp.     Uterus is mobile.     Uterus is not enlarged.     No uterine mass detected.    Uterus is midaxial.     No right or left adnexal mass present.     Right adnexa not tender.     Left adnexa not tender.  HENT:     Head: Normocephalic and atraumatic. No laceration.     Right Ear: Hearing normal.     Left Ear:  Hearing normal.     Mouth/Throat:     Pharynx: Uvula midline.  Eyes:     Pupils: Pupils are equal, round, and reactive to light.  Neck:     Thyroid: No thyromegaly.  Cardiovascular:     Rate and Rhythm: Normal rate and regular rhythm.     Heart sounds: No murmur heard.  No friction rub. No gallop.   Pulmonary:     Effort: Pulmonary effort is normal. No respiratory distress.     Breath sounds: Normal breath sounds. No wheezing.  Chest:     Breasts:        Right: No mass, skin change or tenderness.        Left: No mass, skin change or tenderness.  Abdominal:     General: Bowel sounds are normal. There is no distension.     Palpations: Abdomen is soft.     Tenderness: There is no abdominal tenderness. There is no rebound.  Musculoskeletal:        General: Normal range of motion.     Cervical back: Normal range of motion and neck supple.  Neurological:     Mental Status: She is alert and oriented to person, place, and time.     Cranial Nerves: No cranial nerve deficit.  Skin:    General: Skin is warm and dry.  Psychiatric:        Judgment: Judgment normal.  Vitals reviewed.     Assessment:  ANNUAL EXAM 1. Women's annual routine gynecological examination   2. Screening for cervical cancer   3. Encounter for screening mammogram for malignant neoplasm of breast   4. Menorrhagia with regular cycle      Screening Plan:            1.  Cervical Screening-  Pap smear done today  2. Breast screening- Exam annually and mammogram>40 planned   3. Colonoscopy every 10 years, Hemoccult testing - after age 53  4. Labs managed by PCP  5. Counseling for contraception: condoms   6. Menorrhagia with regular cycle - Patient has abnormal uterine bleeding . She has a normal exam today, with no evidence of lesions.  Evaluation includes the following: exam, labs such as hormonal testing, and pelvic ultrasound to evaluate for any structural gynecologic abnormalities.  Patient to follow up  after testing.  Treatment option for menorrhagia or menometrorrhagia discussed in great detail with the patient.  Options include hormonal therapy, IUD therapy such as Mirena, D&C, Ablation, and Hysterectomy.  The pros and cons of each option discussed with patient.  Considering ablation.    F/U  Return in about 1 week (around 10/04/2020) for Follow up w GYN [place].  Annamarie Major, MD, [facility], [facility] 09/27/2020  10:39 AM

## 2020-09-28 LAB — CYTOLOGY - PAP
Comment: NEGATIVE
Diagnosis: NEGATIVE
High risk HPV: NEGATIVE

## 2020-10-12 ENCOUNTER — Other Ambulatory Visit: Payer: Self-pay

## 2020-10-12 ENCOUNTER — Ambulatory Visit (INDEPENDENT_AMBULATORY_CARE_PROVIDER_SITE_OTHER): Payer: No Typology Code available for payment source

## 2020-10-12 ENCOUNTER — Other Ambulatory Visit: Payer: Self-pay | Admitting: Obstetrics & Gynecology

## 2020-10-12 ENCOUNTER — Ambulatory Visit (INDEPENDENT_AMBULATORY_CARE_PROVIDER_SITE_OTHER): Payer: No Typology Code available for payment source | Admitting: Obstetrics & Gynecology

## 2020-10-12 ENCOUNTER — Encounter: Payer: Self-pay | Admitting: Obstetrics & Gynecology

## 2020-10-12 VITALS — BP 102/70 | Ht 70.0 in | Wt 141.0 lb

## 2020-10-12 DIAGNOSIS — N920 Excessive and frequent menstruation with regular cycle: Secondary | ICD-10-CM | POA: Diagnosis not present

## 2020-10-12 DIAGNOSIS — Z3009 Encounter for other general counseling and advice on contraception: Secondary | ICD-10-CM

## 2020-10-12 NOTE — Progress Notes (Signed)
  HPI: Pt cont to have heavy prolonged periods each month, associated with crampy abdominal pains.  Interferes w work and lifestyle.  Pt uses condoms for bc and does not do well w hormones.  No desire for future fertility.  Ultrasound demonstrates no masses seen These findings are normal anatomic findings  PMHx: She  has a past medical history of Chiari malformation, Complication of anesthesia, and Headache. Also,  has a past surgical history that includes Tonsillectomy; Cesarean section; Dilation and curettage of uterus (2013); and laparotomy (N/A, 09/10/2015)., family history includes Brain cancer (age of onset: 40) in her paternal aunt; Brain cancer (age of onset: 104) in her paternal aunt; Breast cancer in her maternal great-grandmother and paternal great-grandmother; Breast cancer (age of onset: 107) in her maternal aunt; Breast cancer (age of onset: 85) in her paternal aunt and paternal aunt.,  reports that she has never smoked. She has never used smokeless tobacco. She reports that she does not drink alcohol and does not use drugs.  She has a current medication list which includes the following prescription(s): cyanocobalamin. Also, is allergic to duragesic-100 [fentanyl], chlorhexidine, and latex.  Review of Systems  All other systems reviewed and are negative.   Objective: BP 102/70   Ht 5\' 10"  (1.778 m)   Wt 141 lb (64 kg)   LMP 10/03/2020   BMI 20.23 kg/m   Physical examination Constitutional NAD, Conversant  Skin No rashes, lesions or ulceration.   Extremities: Moves all appropriately.  Normal ROM for age. No lymphadenopathy.  Neuro: Grossly intact  Psych: Oriented to PPT.  Normal mood. Normal affect.   10/05/2020 PELVIS TRANSVAGINAL NON-OB (TV ONLY)  Result Date: 10/12/2020 Patient Name: Amber Reid DOB: 1981/07/08 MRN: [id number] ULTRASOUND REPORT Location: [facility] Date of Service: 10/12/2020 Indications:Abnormal Uterine Bleeding Findings: The uterus is anteverted and  measures 9.1 x 6.0 x 4.3 cm. Echo texture is homogenous without evidence of focal masses. The Endometrium measures 5.7 mm. Right Ovary measures 3.7 x 2.6 x 1.8 cm. It is normal in appearance. Left Ovary measures 3.0 x 3.2 x 2.4 cm. It is normal in appearance. Survey of the adnexa demonstrates no adnexal masses. There is no free fluid in the cul de sac. Impression: 1. Normal pelvic ultrasound. Recommendations: 1.Clinical correlation with the patient's History and Physical Exam. 13/08/2020, RT Review of ULTRASOUND.    I have personally reviewed images and report of recent ultrasound done at [facility].    Plan of management to be discussed with patient. [facility], MD, FACOG [facility], [facility] 10/12/2020  3:06 PM   Assessment:  Menorrhagia with regular cycle  Sterilization consult  Options discussed, desires ablation over Mirena, hysterectomy, other options.  Pros and cons discussed. Rec tubal to ensure no pregnancy in ablation environment.  Plan Lap Partial salpingectomy AND Hyst D&C Ablation Sch for 11/16  A total of 20 minutes were spent face-to-face with the patient as well as preparation, review, communication, and documentation during this encounter.   12/16, MD, [facility], [facility] 10/12/2020  3:43 PM

## 2020-10-12 NOTE — Patient Instructions (Signed)
Diagnostic Laparoscopy, Care After This sheet gives you information about how to care for yourself after your procedure. Your health care provider may also give you more specific instructions. If you have problems or questions, contact your health care provider. What can I expect after the procedure? After the procedure, it is common to have:  Mild discomfort in the abdomen.  Sore throat. Women who have laparoscopy with pelvic examination may have mild cramping and fluid coming from the vagina for a few days after the procedure. Follow these instructions at home: Medicines  Take over-the-counter and prescription medicines only as told by your health care provider.  If you were prescribed an antibiotic medicine, take it as told by your health care provider. Do not stop taking the antibiotic even if you start to feel better. Driving  Do not drive for 24 hours if you were given a medicine to help you relax (sedative) during your procedure.  Do not drive or use heavy machinery while taking prescription pain medicine. Bathing  Do not take baths, swim, or use a hot tub until your health care provider approves. You may take showers. Incision care   Follow instructions from your health care provider about how to take care of your incisions. Make sure you: ? Wash your hands with soap and water before you change your bandage (dressing). If soap and water are not available, use hand sanitizer. ? Change your dressing as told by your health care provider. ? Leave stitches (sutures), skin glue, or adhesive strips in place. These skin closures may need to stay in place for 2 weeks or longer. If adhesive strip edges start to loosen and curl up, you may trim the loose edges. Do not remove adhesive strips completely unless your health care provider tells you to do that.  Check your incision areas every day for signs of infection. Check for: ? Redness, swelling, or pain. ? Fluid or  blood. ? Warmth. ? Pus or a bad smell. Activity  Return to your normal activities as told by your health care provider. Ask your health care provider what activities are safe for you.  Do not lift anything that is heavier than 10 lb (4.5 kg), or the limit that you are told, until your health care provider says that it is safe. General instructions  To prevent or treat constipation while you are taking prescription pain medicine, your health care provider may recommend that you: ? Drink enough fluid to keep your urine pale yellow. ? Take over-the-counter or prescription medicines. ? Eat foods that are high in fiber, such as fresh fruits and vegetables, whole grains, and beans. ? Limit foods that are high in fat and processed sugars, such as fried and sweet foods.  Do not use any products that contain nicotine or tobacco, such as cigarettes and e-cigarettes. If you need help quitting, ask your health care provider.  Keep all follow-up visits as told by your health care provider. This is important. Contact a health care provider if:  You develop shoulder pain.  You feel lightheaded or faint.  You are unable to pass gas or have a bowel movement.  You feel nauseous or you vomit.  You develop a rash.  You have redness, swelling, or pain around any incision.  You have fluid or blood coming from any incision.  Any incision feels warm to the touch.  You have pus or a bad smell coming from any incision.  You have a fever or chills. Get help   right away if:  You have severe pain.  You have vomiting that does not go away.  You have heavy bleeding from the vagina.  Any incision opens.  You have trouble breathing.  You have chest pain. Summary  After the procedure, it is common to have mild discomfort in the abdomen and a sore throat.  Check your incision areas every day for signs of infection.  Return to your normal activities as told by your health care provider. Ask  your health care provider what activities are safe for you. This information is not intended to replace advice given to you by your health care provider. Make sure you discuss any questions you have with your health care provider. Document Revised: 11/02/2017 Document Reviewed: 05/16/2017 Elsevier Patient Education  2020 Elsevier Inc.  

## 2020-10-12 NOTE — Progress Notes (Signed)
PRE-OPERATIVE HISTORY AND PHYSICAL EXAM  HPI:  Amber Reid is a 39 y.o. [id number] Patient's last menstrual period was 10/03/2020.; she is being admitted for surgery related to abnormal uterine bleeding and requested sterilization.  Pt has heavy prolonged periods associated w pain and does not do well w hormonal therapy.  Prefers ablation, and because of this desires tubal assurance at birth control as well.  PMHx: Past Medical History:  Diagnosis Date  . Chiari malformation    HER ONLY SYMPTOM IS UNSTEADY GAIT OCC WHICH MAY RESULT IN "RUNNING INTO A WALL"  . Complication of anesthesia    H/O BEING AWAKE DURING EARLY PART OF SURGERY EVEN WITH ANESTHESIA BUT NOT BEING ABLE TO MOVE  . Headache    MIGRAINES-WORSE WITH CHIARI MALFORMATION   Past Surgical History:  Procedure Laterality Date  . CESAREAN SECTION     X2  . DILATION AND CURETTAGE OF UTERUS  2013  . LAPAROTOMY N/A 09/10/2015   Procedure: incision of abdominal mass;  Surgeon: Nadara Mustard, MD;  Location: [facility] ORS;  Service: Gynecology;  Laterality: N/A;  . TONSILLECTOMY     Family History  Problem Relation Age of Onset  . Breast cancer Maternal Great-grandmother   . Breast cancer Paternal Great-grandmother   . Breast cancer Maternal Aunt 40  . Breast cancer Paternal Aunt 1  . Brain cancer Paternal Aunt 36  . Breast cancer Paternal Aunt 19  . Brain cancer Paternal Aunt 53   Social History   Tobacco Use  . Smoking status: Never Smoker  . Smokeless tobacco: Never Used  Vaping Use  . Vaping Use: Never used  Substance Use Topics  . Alcohol use: No  . Drug use: No    Current Outpatient Medications:  .  cyanocobalamin (,VITAMIN B-12,) 1000 MCG/ML injection, 100 mcg SQ once daily for 7 days; then administer same dose on alternate days for 7 doses, then every 4 days for 3 weeks, Disp: , Rfl:  Allergies: Duragesic-100 [fentanyl], Chlorhexidine, and Latex  Review of Systems  Constitutional: Negative for  chills, fever and malaise/fatigue.  HENT: Negative for congestion, sinus pain and sore throat.   Eyes: Negative for blurred vision and pain.  Respiratory: Negative for cough and wheezing.   Cardiovascular: Negative for chest pain and leg swelling.  Gastrointestinal: Positive for abdominal pain. Negative for constipation, diarrhea, heartburn, nausea and vomiting.  Genitourinary: Negative for dysuria, frequency, hematuria and urgency.  Musculoskeletal: Negative for back pain, joint pain, myalgias and neck pain.  Skin: Negative for itching and rash.  Neurological: Negative for dizziness, tremors and weakness.  Endo/Heme/Allergies: Does not bruise/bleed easily.  Psychiatric/Behavioral: Negative for depression. The patient is not nervous/anxious and does not have insomnia.     Objective: BP 102/70   Ht 5\' 10"  (1.778 m)   Wt 141 lb (64 kg)   LMP 10/03/2020   BMI 20.23 kg/m   Filed Weights   10/12/20 1503  Weight: 141 lb (64 kg)   Physical Exam Constitutional:      General: She is not in acute distress.    Appearance: She is well-developed.  HENT:     Head: Normocephalic and atraumatic. No laceration.     Right Ear: Hearing normal.     Left Ear: Hearing normal.     Mouth/Throat:     Pharynx: Uvula midline.  Eyes:     Pupils: Pupils are equal, round, and reactive to light.  Neck:     Thyroid: No thyromegaly.  Cardiovascular:     Rate and Rhythm: Normal rate and regular rhythm.     Heart sounds: No murmur heard.  No friction rub. No gallop.   Pulmonary:     Effort: Pulmonary effort is normal. No respiratory distress.     Breath sounds: Normal breath sounds. No wheezing.  Chest:     Breasts:        Right: No mass, skin change or tenderness.        Left: No mass, skin change or tenderness.  Abdominal:     General: Bowel sounds are normal. There is no distension.     Palpations: Abdomen is soft.     Tenderness: There is no abdominal tenderness. There is no rebound.    Musculoskeletal:        General: Normal range of motion.     Cervical back: Normal range of motion and neck supple.  Neurological:     Mental Status: She is alert and oriented to person, place, and time.     Cranial Nerves: No cranial nerve deficit.  Skin:    General: Skin is warm and dry.  Psychiatric:        Judgment: Judgment normal.  Vitals reviewed.    Assessment: 1. Menorrhagia with regular cycle   2. Sterilization consult   Plan Hysteroscopy D&C Ablation for tx of menorrhagia Plan Laparoscopy Partial bilateral salpingectomy for sterility  The patient has been fully informed about all methods of contraception, both temporary and permanent. She understands that tubal ligation is meant to be permanent, absolute and irreversible. She was told that there is an approximately 1 in 400 chance of a pregnancy in the future after tubal ligation. She was told the short and long term complications of tubal ligation. She understands the risks from this surgery include, but are not limited to, the risks of anesthesia, hemorrhage, infection, perforation, and injury to adjacent structures, bowel, bladder and blood vessels.   I have had a careful discussion with this patient about all the options available and the risk/benefits of each. I have fully informed this patient that surgery may subject her to a variety of discomforts and risks: She understands that most patients have surgery with little difficulty, but problems can happen ranging from minor to fatal. These include nausea, vomiting, pain, bleeding, infection, poor healing, hernia, or formation of adhesions. Unexpected reactions may occur from any drug or anesthetic given. Unintended injury may occur to other pelvic or abdominal structures such as Fallopian tubes, ovaries, bladder, ureter (tube from kidney to bladder), or bowel. Nerves going from the pelvis to the legs may be injured. Any such injury may require immediate or later additional  surgery to correct the problem. Excessive blood loss requiring transfusion is very unlikely but possible. Dangerous blood clots may form in the legs or lungs. Physical and sexual activity will be restricted in varying degrees for an indeterminate period of time but most often 2-6 weeks.  Finally, she understands that it is impossible to list every possible undesirable effect and that the condition for which surgery is done is not always cured or significantly improved, and in rare cases may be even worse.Ample time was given to answer all questions.  Annamarie Major, MD, [facility], [facility] 10/12/2020  3:45 PM

## 2020-10-13 ENCOUNTER — Telehealth: Payer: Self-pay | Admitting: Obstetrics & Gynecology

## 2020-10-13 ENCOUNTER — Other Ambulatory Visit: Payer: Self-pay | Admitting: Obstetrics & Gynecology

## 2020-10-13 ENCOUNTER — Encounter (HOSPITAL_COMMUNITY): Payer: Self-pay | Admitting: Urgent Care

## 2020-10-13 MED ORDER — LEVONORGEST-ETH ESTRAD 91-DAY 0.15-0.03 MG PO TABS: 1.0000 | ORAL_TABLET | Freq: Every day | ORAL | 4 refills | Status: DC

## 2020-10-13 NOTE — Telephone Encounter (Signed)
Pt and I have been talking back and forth with each other and with her insurance carrier. Her plan is a low cost base plan with the option to purchase additional coverage as needed. She had to contact them with the CPT code. Her cost to have Hysteroscopy[facility]abl[facility] at [facility] is $2700 and in office is $3200. Pt is frustrated and said that she just wants to discuss "going on a pill" to stop her periods and forgo the surgery for now. She said maybe she could have surgery after the holidays.   I adv that I would forward this to Dr Tiburcio Pea and that he would follow back up with her via phone. [phone]

## 2020-10-13 NOTE — Telephone Encounter (Signed)
Called patient to schedule Laparoscopy with Tubal Partial Salpingectomy Hysteroscopy D&C with Endometrial Ablation  DOS 11/16  H&P N/A  Covid testing not required  Pre-admit phone call appointment to be requested - All appointments will be updated on pt MyChart. Explained that this appointment has a call window. Based on the time scheduled will indicate if the call will be received within a 4 hour window before 1:00 or after.  Advised that pt may also receive calls from the hospital pharmacy and pre-service center.  Confirmed pt has [employer] as Editor, commissioning. No secondary insurance.  Confirmed Vic Ripper is available with [employer]

## 2020-10-13 NOTE — Telephone Encounter (Signed)
Plz cancel surgery and cancel 12/1 appt  Plan OCP extended use therapy to see if helps w sx's of menorrhagia. Alternative options discussed as well Pt not a smoker nor has other contraindications for OCP therapy Pros and cons discussed

## 2020-10-13 NOTE — Telephone Encounter (Signed)
-----   Message from Nadara Mustard, MD sent at 10/12/2020  3:35 PM EST ----- Regarding: surgery Surgery Booking Request Patient Full Name:  Amber Reid  MRN: [id number]  DOB: [birth year]  Surgeon: Letitia Libra, MD  Requested Surgery Date and Time: 10/19/2020 Primary Diagnosis AND Code:   ICD-10-CM  1. Menorrhagia with regular cycle  N92.0  2. Sterilization consult  Z30.09  Secondary Diagnosis and Code:  Surgical Procedure: Laparoscopy with Tubal Partial Salpingectomy Hysteroscopy D&C with Endometrial Ablation RNFA Requested?: No L&D Notification: No Admission Status: same day surgery Length of Surgery: 50 min Special Case Needs: [place] Ablation H&P: No Phone Interview???:  Yes Interpreter: No Medical Clearance:  No Special Scheduling Instructions: No Any known health/anesthesia issues, diabetes, sleep apnea, latex allergy, defibrillator/pacemaker?: No Acuity: P3   (P1 highest, P2 delay may cause harm, P3 low, elective gyn, P4 lowest)

## 2020-10-14 ENCOUNTER — Encounter: Payer: Self-pay | Admitting: Obstetrics and Gynecology

## 2020-10-15 ENCOUNTER — Inpatient Hospital Stay: Admission: RE | Admit: 2020-10-15 | Payer: BLUE CROSS/BLUE SHIELD | Source: Ambulatory Visit

## 2020-10-19 ENCOUNTER — Encounter: Admission: RE | Payer: Self-pay | Source: Home / Self Care

## 2020-10-19 ENCOUNTER — Ambulatory Visit
Admission: RE | Admit: 2020-10-19 | Payer: No Typology Code available for payment source | Source: Home / Self Care | Admitting: Obstetrics & Gynecology

## 2020-10-19 SURGERY — LIGATION, FALLOPIAN TUBE, LAPAROSCOPIC
Anesthesia: Choice

## 2020-11-03 ENCOUNTER — Ambulatory Visit: Payer: No Typology Code available for payment source | Admitting: Obstetrics & Gynecology

## 2020-11-15 ENCOUNTER — Other Ambulatory Visit: Payer: Self-pay

## 2020-11-15 ENCOUNTER — Ambulatory Visit
Admission: RE | Admit: 2020-11-15 | Discharge: 2020-11-15 | Disposition: A | Discharge status: Home / Self Care | Payer: No Typology Code available for payment source | Source: Ambulatory Visit | Attending: Obstetrics & Gynecology | Admitting: Obstetrics & Gynecology

## 2020-11-15 DIAGNOSIS — Z1231 Encounter for screening mammogram for malignant neoplasm of breast: Secondary | ICD-10-CM | POA: Diagnosis not present

## 2021-01-12 ENCOUNTER — Telehealth: Payer: Self-pay

## 2021-01-12 NOTE — Telephone Encounter (Signed)
Can have Beta hCG to confirm not pregnancy related.  Counsel that it is likely heavy period as she has had in past, and that it came early is not unusual.

## 2021-01-12 NOTE — Telephone Encounter (Signed)
Patient aware. She does not desire a Beta Hcg at this time. She will schedule f/u as needed.

## 2021-01-12 NOTE — Telephone Encounter (Signed)
Pt called and stated that she has excessive bleeding. Pt says on 12/31/20 she had a serve headache and serve abdominal pain. She got in the bath tub and passed a very large bloody tissue. She's not sure if she had a miscarriage. She not sure if she needs to come in for labs or what, but she's still bleeding and thinks now its her period starting. She would like a CB [phone] and some advice please.

## 2021-01-12 NOTE — Telephone Encounter (Signed)
Spoke w/patient. LMP 01/01/21. On 3 months continuous pills. Has not missed any pills. She was not supposed to start period until this week. She denies completely filling pad and having to change more than one time per hour. She is able to work but feels a little fatigued. Inquiring if needs labs? Advised will need to schedule apt for evaluation and other treament plan options. She said her last surgery was cancelled d/t insurance not covering.

## 2021-10-10 ENCOUNTER — Telehealth: Payer: Self-pay | Admitting: Obstetrics & Gynecology

## 2021-10-10 ENCOUNTER — Other Ambulatory Visit: Payer: Self-pay | Admitting: Obstetrics & Gynecology

## 2021-10-11 NOTE — Telephone Encounter (Signed)
Sch Annual Appt

## 2021-10-12 ENCOUNTER — Other Ambulatory Visit: Payer: Self-pay | Admitting: Obstetrics & Gynecology

## 2021-10-12 NOTE — Telephone Encounter (Signed)
Called and left voicemail for patient to call back to be scheduled. 

## 2021-10-12 NOTE — Telephone Encounter (Signed)
Patient is scheduled for 12/19/20 with [facility] for annual

## 2021-12-19 ENCOUNTER — Ambulatory Visit: Payer: No Typology Code available for payment source | Admitting: Obstetrics & Gynecology

## 2021-12-25 ENCOUNTER — Telehealth: Payer: Self-pay | Admitting: Obstetrics & Gynecology

## 2021-12-26 NOTE — Telephone Encounter (Signed)
Patient is scheduled for 03/08/22 with [facility]

## 2021-12-26 NOTE — Telephone Encounter (Signed)
[place] last day is on 03/21/22. [facility] wuld like patient to be added to her calendar prior to the 03/27/22. Contacted patient via phone left voicemail to call back to be scheduled

## 2021-12-26 NOTE — Telephone Encounter (Signed)
Sch annual prior to 03/27/22

## 2022-03-08 ENCOUNTER — Ambulatory Visit (INDEPENDENT_AMBULATORY_CARE_PROVIDER_SITE_OTHER): Payer: No Typology Code available for payment source | Admitting: Obstetrics & Gynecology

## 2022-03-08 ENCOUNTER — Encounter: Payer: Self-pay | Admitting: Obstetrics & Gynecology

## 2022-03-08 VITALS — BP 120/80 | Ht 70.0 in | Wt 142.0 lb

## 2022-03-08 DIAGNOSIS — Z01419 Encounter for gynecological examination (general) (routine) without abnormal findings: Secondary | ICD-10-CM

## 2022-03-08 DIAGNOSIS — Z1231 Encounter for screening mammogram for malignant neoplasm of breast: Secondary | ICD-10-CM | POA: Diagnosis not present

## 2022-03-08 MED ORDER — LEVONORGEST-ETH ESTRAD 91-DAY 0.15-0.03 MG PO TABS: 1.0000 | ORAL_TABLET | Freq: Every day | ORAL | 3 refills | Status: AC

## 2022-03-08 NOTE — Patient Instructions (Signed)
This is the new practice information you requested: ? ?Dr. Clarisse [facility] ?[address] ?[address] ?[place], [place]  [address] ?[phone] ? ?PAP every three years ?Mammogram every year ?   Call [phone] to schedule at [facility] ?

## 2022-03-08 NOTE — Progress Notes (Signed)
? ?HPI: ?     [name] is a 41 y.o. [id number] who LMP was No LMP recorded., she presents today for her annual examination. The patient has no complaints today. Periods well controlled on OCPs.  The patient is sexually active. Her last pap: approximate date 2021 and was normal and last mammogram: was normal. The patient does perform self breast exams.  There is no notable family history of breast or ovarian cancer in her family.  The patient has regular exercise: yes.  The patient denies current symptoms of depression.   ? ?GYN History: ?Contraception: OCP (estrogen/progesterone) ? ?PMHx: ?Past Medical History:  ?Diagnosis Date  ? Chiari malformation   ? HER ONLY SYMPTOM IS UNSTEADY GAIT OCC WHICH MAY RESULT IN "RUNNING INTO A WALL"  ? Complication of anesthesia   ? H/O BEING AWAKE DURING EARLY PART OF SURGERY EVEN WITH ANESTHESIA BUT NOT BEING ABLE TO MOVE  ? Family history of breast cancer   ? 11/21 cancer genetic testing letter sent  ? Headache   ? MIGRAINES-WORSE WITH CHIARI MALFORMATION  ? ?Past Surgical History:  ?Procedure Laterality Date  ? CESAREAN SECTION    ? X2  ? DILATION AND CURETTAGE OF UTERUS  2013  ? LAPAROTOMY N/A 09/10/2015  ? Procedure: incision of abdominal mass;  Surgeon: Nadara Mustard, MD;  Location: [facility] ORS;  Service: Gynecology;  Laterality: N/A;  ? TONSILLECTOMY    ? ?Family History  ?Problem Relation Age of Onset  ? Breast cancer Maternal Great-grandmother   ? Breast cancer Paternal Great-grandmother   ? Breast cancer Maternal Aunt 40  ? Breast cancer Paternal Aunt 71  ? Brain cancer Paternal Aunt 97  ? Breast cancer Paternal Aunt 34  ? Brain cancer Paternal Aunt 69  ? ?Social History  ? ?Tobacco Use  ? Smoking status: Never  ? Smokeless tobacco: Never  ?Vaping Use  ? Vaping Use: Never used  ?Substance Use Topics  ? Alcohol use: No  ? Drug use: No  ? ? ?Current Outpatient Medications:  ?  cyanocobalamin (,VITAMIN B-12,) 1000 MCG/ML injection, Inject into the muscle See admin  instructions. Inject 100 mcg into the skin every 4 days (Patient not taking: Reported on 03/08/2022), Disp: , Rfl:  ?  levonorgestrel-ethinyl estradiol (SEASONALE) 0.15-0.03 MG tablet, Take 1 tablet by mouth daily., Disp: 90 tablet, Rfl: 3 ?Allergies: Duragesic-100 [fentanyl], Chlorhexidine, and Latex ? ?Review of Systems  ?Constitutional:  Negative for chills, fever and malaise/fatigue.  ?HENT:  Negative for congestion, sinus pain and sore throat.   ?Eyes:  Negative for blurred vision and pain.  ?Respiratory:  Negative for cough and wheezing.   ?Cardiovascular:  Negative for chest pain and leg swelling.  ?Gastrointestinal:  Negative for abdominal pain, constipation, diarrhea, heartburn, nausea and vomiting.  ?Genitourinary:  Negative for dysuria, frequency, hematuria and urgency.  ?Musculoskeletal:  Negative for back pain, joint pain, myalgias and neck pain.  ?Skin:  Negative for itching and rash.  ?Neurological:  Negative for dizziness, tremors and weakness.  ?Endo/Heme/Allergies:  Does not bruise/bleed easily.  ?Psychiatric/Behavioral:  Negative for depression. The patient is not nervous/anxious and does not have insomnia.   ? ?Objective: ?BP 120/80   Ht 5\' 10"  (1.778 m)   Wt 142 lb (64.4 kg)   BMI 20.37 kg/m?   ?Filed Weights  ? 03/08/22 1531  ?Weight: 142 lb (64.4 kg)  ? Body mass index is 20.37 kg/m?05/08/22 ?Physical Exam ?Constitutional:   ?   General: She is  not in acute distress. ?   Appearance: She is well-developed.  ?Genitourinary:  ?   Bladder, rectum and urethral meatus normal.  ?   No lesions in the vagina.  ?   Right Labia: No rash, tenderness or lesions. ?   Left Labia: No tenderness, lesions or rash. ?   No vaginal bleeding.  ? ?   Right Adnexa: not tender and no mass present. ?   Left Adnexa: not tender and no mass present. ?   No cervical motion tenderness, friability, lesion or polyp.  ?   Uterus is not enlarged.  ?   No uterine mass detected. ?   Pelvic exam was performed with patient in the  lithotomy position.  ?Breasts: ?   Right: No mass, skin change or tenderness.  ?   Left: No mass, skin change or tenderness.  ?HENT:  ?   Head: Normocephalic and atraumatic. No laceration.  ?   Right Ear: Hearing normal.  ?   Left Ear: Hearing normal.  ?   Mouth/Throat:  ?   Pharynx: Uvula midline.  ?Eyes:  ?   Pupils: Pupils are equal, round, and reactive to light.  ?Neck:  ?   Thyroid: No thyromegaly.  ?Cardiovascular:  ?   Rate and Rhythm: Normal rate and regular rhythm.  ?   Heart sounds: No murmur heard. ?  No friction rub. No gallop.  ?Pulmonary:  ?   Effort: Pulmonary effort is normal. No respiratory distress.  ?   Breath sounds: Normal breath sounds. No wheezing.  ?Abdominal:  ?   General: Bowel sounds are normal. There is no distension.  ?   Palpations: Abdomen is soft.  ?   Tenderness: There is no abdominal tenderness. There is no rebound.  ?Musculoskeletal:     ?   General: Normal range of motion.  ?   Cervical back: Normal range of motion and neck supple.  ?Neurological:  ?   Mental Status: She is alert and oriented to person, place, and time.  ?   Cranial Nerves: No cranial nerve deficit.  ?Skin: ?   General: Skin is warm and dry.  ?Psychiatric:     ?   Judgment: Judgment normal.  ?Vitals reviewed.  ? ? ?Assessment:  ANNUAL EXAM ?1. Women's annual routine gynecological examination   ?2. Encounter for screening mammogram for malignant neoplasm of breast   ? ? ? ?Screening Plan: ?           ?1.  Cervical Screening-  Pap smear not done today ?Due 2024 ? ?2. Breast screening- Exam annually and mammogram>40 planned  ? ?3. Colonoscopy every 10 years, Hemoccult testing - after age 22 ? ?4. Labs managed by PCP ? ?5. Counseling for contraception: oral contraceptives (estrogen/progesterone) ? ? ?  F/U ? Return in about 1 year (around 03/09/2023) for and Annual when due. ? ?Annamarie Major, MD, FACOG ?[facility], [facility] ?03/08/2022  3:48 PM ? ? ?

## 2022-03-27 ENCOUNTER — Ambulatory Visit: Payer: No Typology Code available for payment source | Admitting: Obstetrics & Gynecology

## 2022-04-14 ENCOUNTER — Ambulatory Visit
Admission: RE | Admit: 2022-04-14 | Discharge: 2022-04-14 | Disposition: A | Discharge status: Home / Self Care | Payer: No Typology Code available for payment source | Source: Ambulatory Visit | Attending: Obstetrics & Gynecology | Admitting: Obstetrics & Gynecology

## 2022-04-14 ENCOUNTER — Other Ambulatory Visit: Payer: Self-pay | Admitting: Nurse Practitioner

## 2022-04-14 DIAGNOSIS — Z1231 Encounter for screening mammogram for malignant neoplasm of breast: Secondary | ICD-10-CM | POA: Diagnosis present

## 2023-11-28 IMAGING — MG MM DIGITAL SCREENING BILAT W/ TOMO AND CAD
6 of 10 series · 6 of 30 positions shown · non-contrast
Comparison: Previous exam(s).

CLINICAL DATA: Screening.

EXAM:
DIGITAL SCREENING BILATERAL MAMMOGRAM WITH TOMOSYNTHESIS AND CAD
TECHNIQUE: Bilateral screening digital craniocaudal and mediolateral oblique
mammograms were obtained. Bilateral screening digital breast
tomosynthesis was performed. The images were evaluated with
computer-aided detection.

[R CC synth-2D]
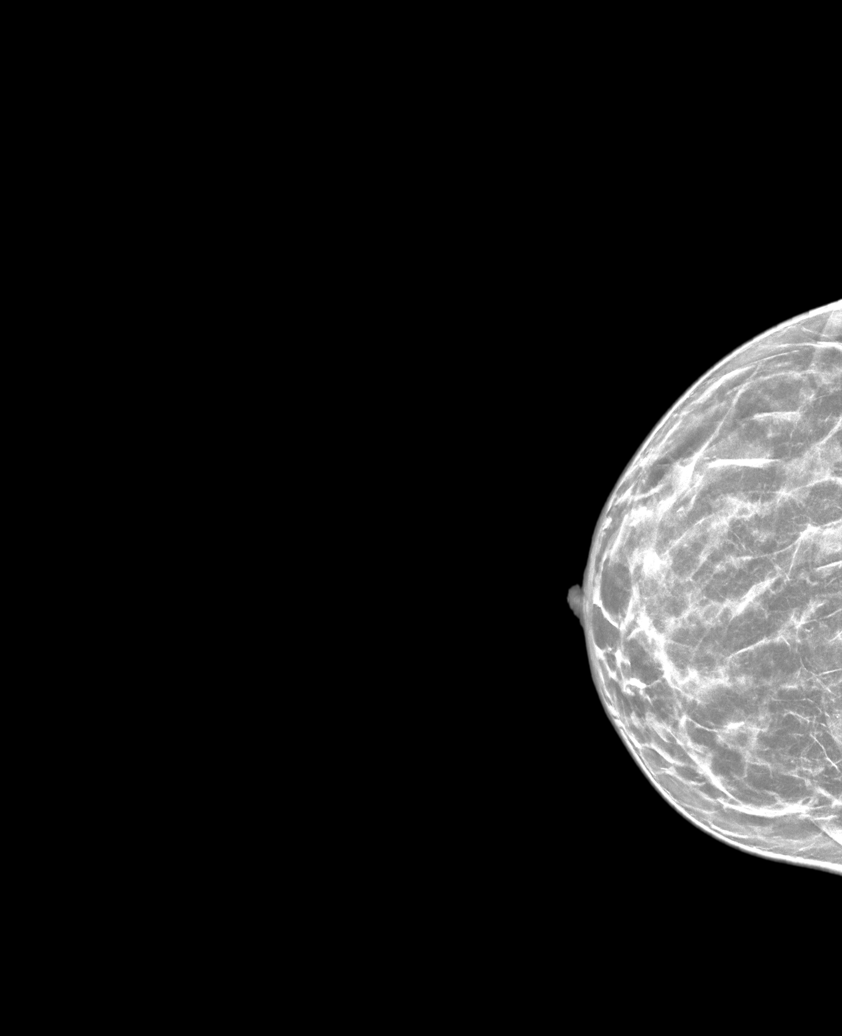

[L CC synth-2D (1 of 2)]
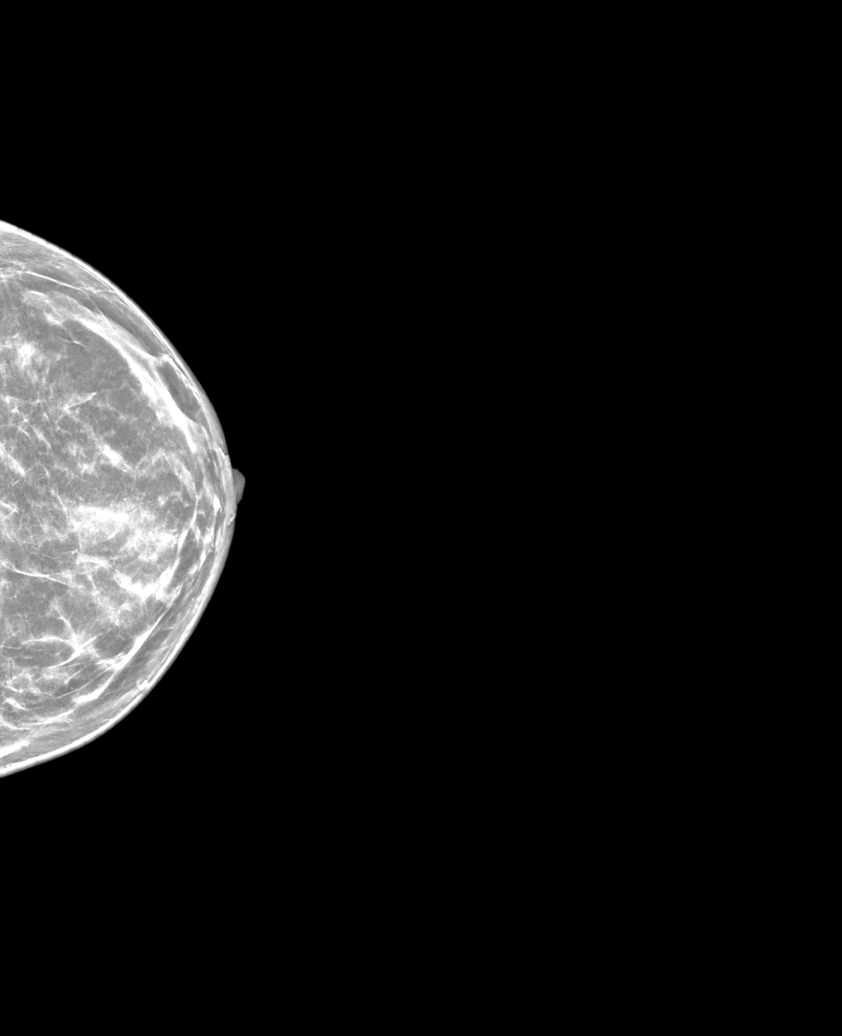

[L CC synth-2D (2 of 2)]
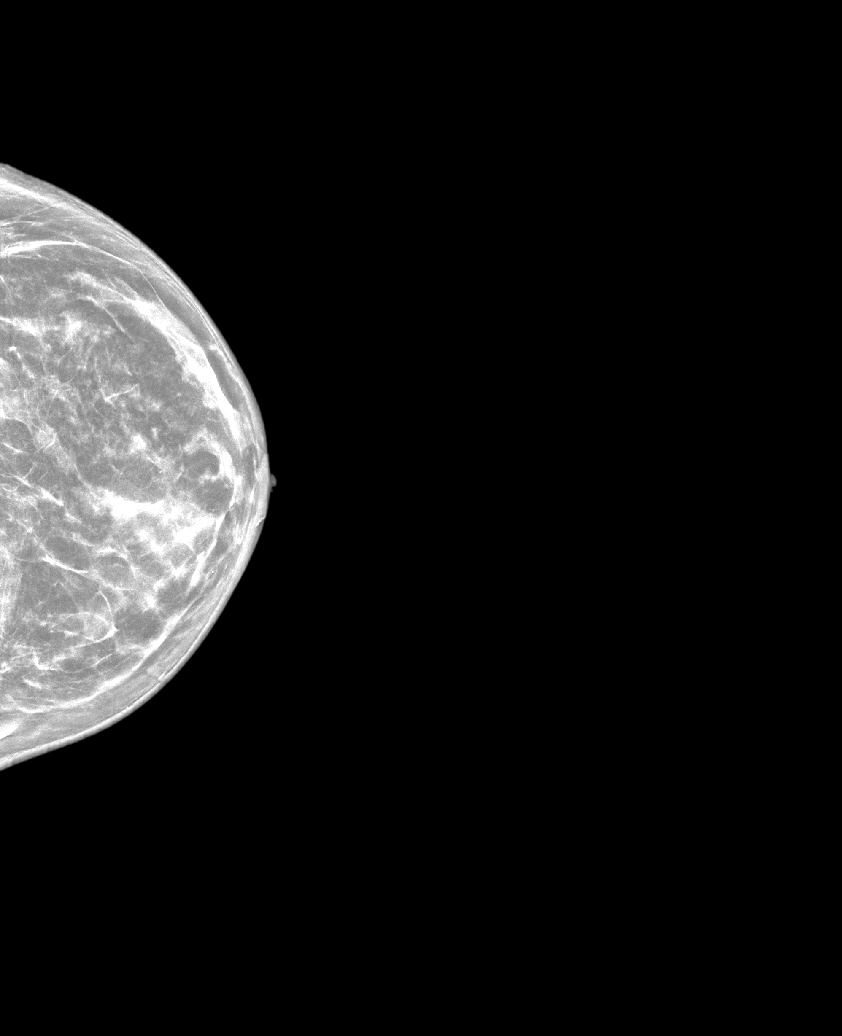

[R MLO synth-2D]
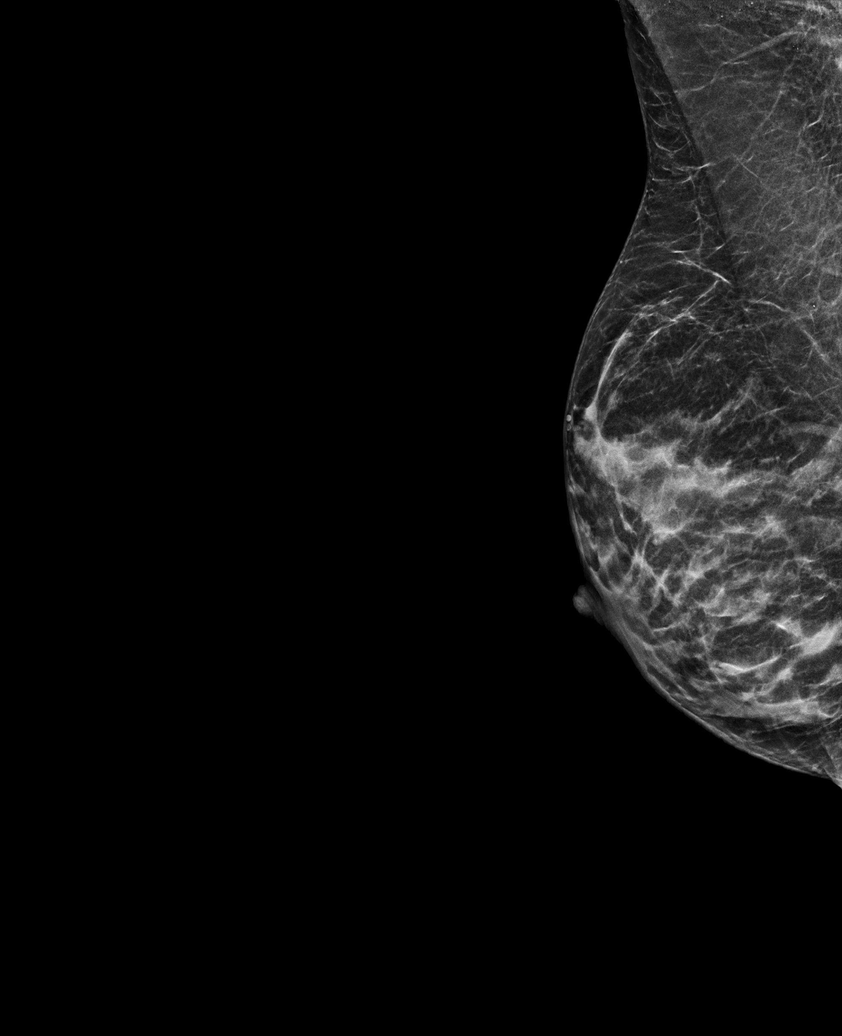

[L MLO synth-2D]
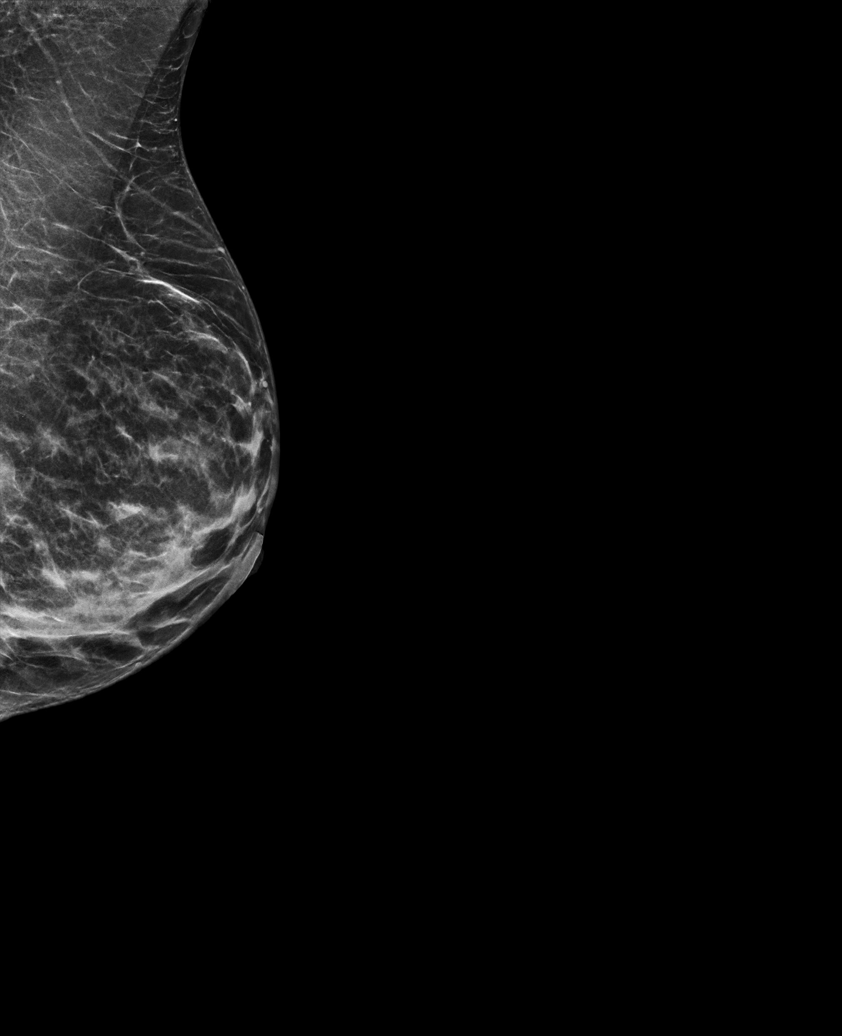

[R CC tomo · tomo slice 30/59.0]
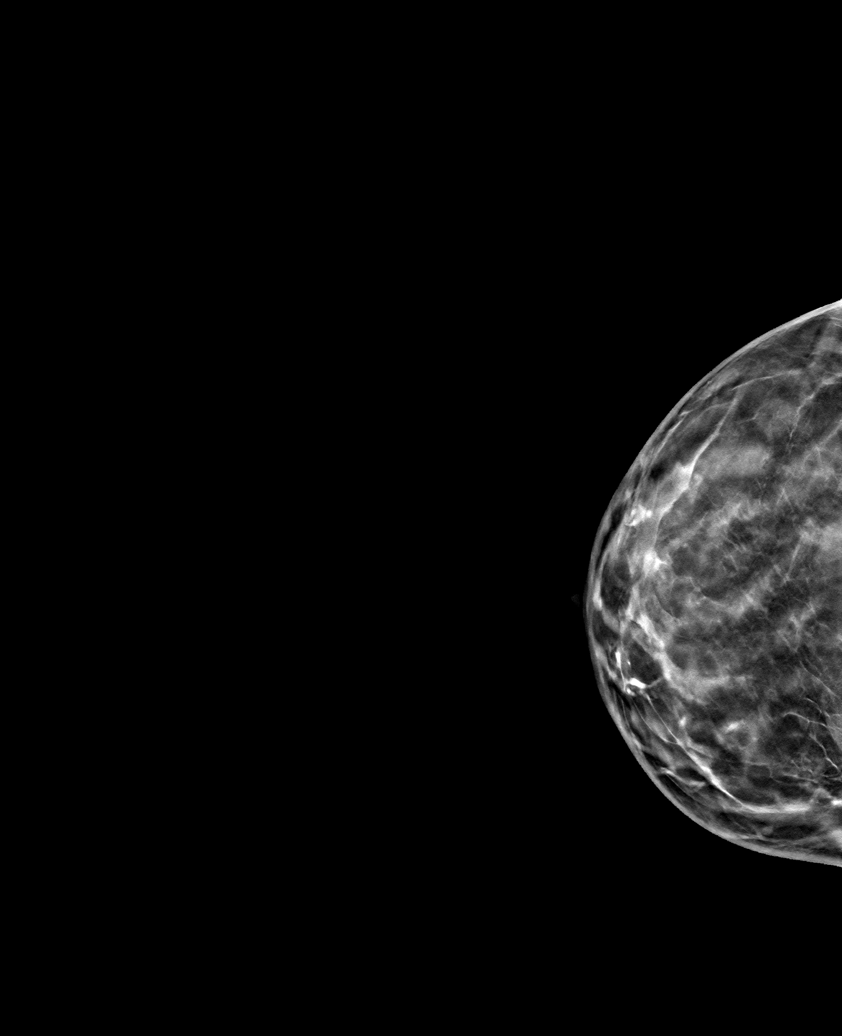

[6 of 30 positions shown; findings below may reference images not displayed]

ACR Breast Density Category c: The breast tissue is heterogeneously
dense, which may obscure small masses.
FINDINGS: There are no findings suspicious for malignancy.
IMPRESSION: No mammographic evidence of malignancy. A result letter of this
screening mammogram will be mailed directly to the patient.

RECOMMENDATION:
Screening mammogram in one year. (Code:Q3-W-BC3)

BI-RADS CATEGORY  1: Negative.
